# Patient Record
Sex: Male | Born: 1980 | Race: White | Hispanic: No | Smoking: Never smoker
Health system: Southern US, Community
[De-identification: ages and names within clinical notes are randomized; demographics above are authoritative.]

## PROBLEM LIST (undated history)

## (undated) DIAGNOSIS — M069 Rheumatoid arthritis, unspecified: Secondary | ICD-10-CM

## (undated) DIAGNOSIS — L409 Psoriasis, unspecified: Secondary | ICD-10-CM

## (undated) HISTORY — PX: KNEE SURGERY: SHX244

---

## 2010-09-11 ENCOUNTER — Emergency Department: Payer: Self-pay | Admitting: Emergency Medicine

## 2010-09-13 ENCOUNTER — Inpatient Hospital Stay: Payer: Self-pay | Admitting: Internal Medicine

## 2014-07-11 ENCOUNTER — Emergency Department
Admission: EM | Admit: 2014-07-11 | Discharge: 2014-07-11 | Disposition: A | Discharge status: Home / Self Care | Payer: BLUE CROSS/BLUE SHIELD | Attending: Emergency Medicine | Admitting: Emergency Medicine

## 2014-07-11 DIAGNOSIS — L237 Allergic contact dermatitis due to plants, except food: Secondary | ICD-10-CM | POA: Diagnosis not present

## 2014-07-11 DIAGNOSIS — R21 Rash and other nonspecific skin eruption: Secondary | ICD-10-CM | POA: Diagnosis present

## 2014-07-11 MED ORDER — PREDNISONE 10 MG PO TABS
10.0000 mg | ORAL_TABLET | Freq: Every day | ORAL | Status: DC
Start: 2014-07-11 — End: 2022-06-15

## 2014-07-11 MED ORDER — DEXAMETHASONE SODIUM PHOSPHATE 10 MG/ML IJ SOLN
10.0000 mg | Freq: Once | INTRAMUSCULAR | Status: AC
  Administered 2014-07-11: 10 mg via INTRAMUSCULAR

## 2014-07-11 MED ORDER — PREDNISONE 10 MG PO TABS: 10.0000 mg | ORAL_TABLET | Freq: Every day | ORAL | Status: DC

## 2014-07-11 MED ORDER — DEXAMETHASONE SODIUM PHOSPHATE 10 MG/ML IJ SOLN
INTRAMUSCULAR | Status: AC
  Filled 2014-07-11: qty 1

## 2014-07-11 NOTE — ED Notes (Addendum)
Pt c/o rash x 1 week.  Pt states rash on arms, torso, legs, and spot next to eye.  Rash red and bumpy.  Pt reports rash itches.  Pt reports he tried home remedies w/o relief.  NAD at this time.

## 2014-07-11 NOTE — ED Provider Notes (Signed)
CSN: [id number]     Arrival date & time 07/11/14  2007 History   First MD Initiated Contact with Patient 07/11/14 2259     Chief Complaint  Patient presents with  . Poison Ivy    Pt presents to ER stating he was exposed to poison ivy/oak a week ago and it has not improved. Pt has weeping wounds noted to bilat arms.     (Consider location/radiation/quality/duration/timing/severity/associated sxs/prior Treatment) HPI  34 year old male with 7 day history of rash to upper extremities, abdomen, back, bilateral legs. Patient states 7 days ago he was cutting brush with a chainsaw. He cut into poison ivy vine. One day later, he developed erythematous. It rash on the volar aspect of both forearms, abdomen and lower back. Patient has not been taking anything for the itching. He denies any chest pain shortness of breath or wheezing. Itching is moderate. He denies any significant pain.   No past medical history on file. No past surgical history on file. No family history on file. History  Substance Use Topics  . Smoking status: Not on file  . Smokeless tobacco: Not on file  . Alcohol Use: Not on file    Review of Systems  Constitutional: Negative.  Negative for fever, chills, activity change and appetite change.  HENT: Negative for congestion, ear pain, mouth sores, rhinorrhea, sinus pressure, sore throat and trouble swallowing.   Eyes: Negative for photophobia, pain and discharge.  Respiratory: Negative for cough, chest tightness and shortness of breath.   Cardiovascular: Negative for chest pain and leg swelling.  Gastrointestinal: Negative for nausea, vomiting, abdominal pain, diarrhea and abdominal distention.  Genitourinary: Negative for dysuria and difficulty urinating.  Musculoskeletal: Negative for back pain, arthralgias and gait problem.  Skin: Positive for rash. Negative for color change.  Neurological: Negative for dizziness and headaches.  Hematological: Negative for adenopathy.   Psychiatric/Behavioral: Negative for behavioral problems and agitation.      Allergies  Review of patient's allergies indicates no known allergies.  Home Medications   Prior to Admission medications   Medication Sig Start Date End Date Taking? Authorizing Provider  predniSONE (DELTASONE) 10 MG tablet Take 1 tablet (10 mg total) by mouth daily. 07/11/14   Evon Slack, PA-C   BP 128/89 mmHg  Pulse 102  Temp(Src) 99.2 F (37.3 C) (Oral)  Resp 20  Ht 5\' 9"  (1.753 m)  Wt 260 lb (117.935 kg)  BMI 38.38 kg/m2  SpO2 95% Physical Exam  Constitutional: He is oriented to person, place, and time. He appears well-developed and well-nourished.  HENT:  Head: Normocephalic and atraumatic.  Eyes: Conjunctivae and EOM are normal. Pupils are equal, round, and reactive to light.  Neck: Normal range of motion. Neck supple.  Cardiovascular: Normal rate, regular rhythm, normal heart sounds and intact distal pulses.   Pulmonary/Chest: Effort normal and breath sounds normal. No respiratory distress. He has no wheezes. He has no rales. He exhibits no tenderness.  Abdominal: Soft. Bowel sounds are normal.  Musculoskeletal: Normal range of motion. He exhibits no edema or tenderness.  Neurological: He is alert and oriented to person, place, and time. No cranial nerve deficit.  Skin: Skin is warm and dry. Rash noted. There is erythema.  Patient with erythematous papular rash that is confluent on the volar aspects of both forearms, abdomen, lower back and bilateral anterior thighs.  Psychiatric: He has a normal mood and affect. His behavior is normal. Judgment and thought content normal.    ED Course  Procedures (including critical care time) Labs Review Labs Reviewed - No data to display  Imaging Review No results found.   EKG Interpretation None      MDM   Final diagnoses:  Poison ivy dermatitis    Patient given injection of steroids in the ER. He was sent home with a 6 day  prednisone taper. He'll continue with Benadryl, calamine lotion. Avoid hot showers. Return to the ER for any worsening symptoms or changes in his health.    Evon Slack, PA-C 07/11/14 2323  Myrna Blazer, MD 07/12/14 [phone]

## 2014-07-11 NOTE — Discharge Instructions (Signed)
Contact Dermatitis °Contact dermatitis is a rash that happens when something touches the skin. You touched something that irritates your skin, or you have allergies to something you touched. °HOME CARE  °· Avoid the thing that caused your rash. °· Keep your rash away from hot water, soap, sunlight, chemicals, and other things that might bother it. °· Do not scratch your rash. °· You can take cool baths to help stop itching. °· Only take medicine as told by your doctor. °· Keep all doctor visits as told. °GET HELP RIGHT AWAY IF:  °· Your rash is not better after 3 days. °· Your rash gets worse. °· Your rash is puffy (swollen), tender, red, sore, or warm. °· You have problems with your medicine. °MAKE SURE YOU:  °· Understand these instructions. °· Will watch your condition. °· Will get help right away if you are not doing well or get worse. °Document Released: 12/04/2008 Document Revised: 05/01/2011 Document Reviewed: 07/12/2010 °[employer]® Patient Information ©2015 [employer]. This information is not intended to replace advice given to you by your health care provider. Make sure you discuss any questions you have with your health care provider. ° °

## 2014-07-11 NOTE — ED Notes (Signed)
Pt presents to ER stating he was exposed to poison ivy/oak a week ago and it has not improved. Pt has weeping wounds noted to bilat arms.

## 2016-01-12 ENCOUNTER — Emergency Department: Payer: Worker's Compensation

## 2016-01-12 ENCOUNTER — Emergency Department
Admission: EM | Admit: 2016-01-12 | Discharge: 2016-01-12 | Payer: Worker's Compensation | Attending: Emergency Medicine | Admitting: Emergency Medicine

## 2016-01-12 ENCOUNTER — Encounter: Payer: Self-pay | Admitting: Emergency Medicine

## 2016-01-12 DIAGNOSIS — W268XXA Contact with other sharp object(s), not elsewhere classified, initial encounter: Secondary | ICD-10-CM | POA: Insufficient documentation

## 2016-01-12 DIAGNOSIS — Z23 Encounter for immunization: Secondary | ICD-10-CM | POA: Insufficient documentation

## 2016-01-12 DIAGNOSIS — S51812A Laceration without foreign body of left forearm, initial encounter: Secondary | ICD-10-CM | POA: Insufficient documentation

## 2016-01-12 DIAGNOSIS — S59912A Unspecified injury of left forearm, initial encounter: Secondary | ICD-10-CM | POA: Diagnosis present

## 2016-01-12 DIAGNOSIS — S5402XA Injury of ulnar nerve at forearm level, left arm, initial encounter: Secondary | ICD-10-CM | POA: Insufficient documentation

## 2016-01-12 MED ORDER — TETANUS-DIPHTH-ACELL PERTUSSIS 5-2.5-18.5 LF-MCG/0.5 IM SUSP
0.5000 mL | Freq: Once | INTRAMUSCULAR | Status: AC
  Administered 2016-01-12: 0.5 mL via INTRAMUSCULAR
  Filled 2016-01-12 (×2): qty 0.5

## 2016-01-12 MED ORDER — CEFAZOLIN IN D5W 1 GM/50ML IV SOLN
1.0000 g | Freq: Once | INTRAVENOUS | Status: AC
  Administered 2016-01-12: 1 g via INTRAVENOUS
  Filled 2016-01-12: qty 50

## 2016-01-12 MED ORDER — LIDOCAINE-EPINEPHRINE (PF) 1 %-1:200000 IJ SOLN
INTRAMUSCULAR | Status: AC
  Filled 2016-01-12: qty 30

## 2016-01-12 MED ORDER — MORPHINE SULFATE (PF) 4 MG/ML IV SOLN
4.0000 mg | Freq: Once | INTRAVENOUS | Status: AC
  Administered 2016-01-12: 4 mg via INTRAVENOUS
  Filled 2016-01-12: qty 1

## 2016-01-12 MED ORDER — OXYCODONE-ACETAMINOPHEN 5-325 MG PO TABS
1.0000 | ORAL_TABLET | Freq: Once | ORAL | Status: AC
  Administered 2016-01-12: 1 via ORAL
  Filled 2016-01-12: qty 1

## 2016-01-12 NOTE — ED Provider Notes (Addendum)
[facility] Emergency Department Provider Note  ____________________________________________   I have reviewed the triage vital signs and the nursing notes.   HISTORY  Chief Complaint Extremity Laceration    HPI William Burgess is a 35 y.o. 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 He is right-hand dominant. He was using his left hand to push something metal and it slipped, he suffered a large laceration to left for arm. This happened medially prior to arrival here. He states he has decreased sensation in the fourth and fifth digit. Bleeding controlled. No other injury. Tetanus not up-to-date.     History reviewed. No pertinent past medical history.  There are no active problems to display for this patient.   Past Surgical History:  Procedure Laterality Date  . KNEE SURGERY      Prior to Admission medications   Medication Sig Start Date End Date Taking? Authorizing Provider  predniSONE (DELTASONE) 10 MG tablet Take 1 tablet (10 mg total) by mouth daily. 07/11/14   Evon Slack, PA-C  predniSONE (DELTASONE) 10 MG tablet Take 1 tablet (10 mg total) by mouth daily. 6,5,4,3,2,1 six day taper 07/11/14   Evon Slack, PA-C    Allergies Patient has no known allergies.  No family history on file.  Social History Social History  Substance Use Topics  . Smoking status: Never Smoker  . Smokeless tobacco: Never Used  . Alcohol use No    Review of Systems Constitutional: No fever/chills Eyes: No visual changes. ENT: No sore throat. No stiff neck no neck pain Cardiovascular: Denies chest pain. Respiratory: Denies shortness of breath. Gastrointestinal:   no vomiting.  No diarrhea.  No constipation. Genitourinary: Negative for dysuria. Musculoskeletal: Negative lower extremity swelling Skin: Negative for rash. Neurological: Negative for severe headaches, focal weakness or  numbness. 10-point ROS otherwise negative.  ____________________________________________   PHYSICAL EXAM:  VITAL SIGNS: ED Triage Vitals  Enc Vitals Group     BP 01/12/16 1730 (!) 138/95     Pulse Rate 01/12/16 1730 68     Resp 01/12/16 1730 18     Temp 01/12/16 1730 99.1 F (37.3 C)     Temp Source 01/12/16 1730 Oral     SpO2 01/12/16 1730 98 %     Weight 01/12/16 1730 275 lb (124.7 kg)     Height 01/12/16 1730 6\' 2"  (1.88 m)     Head Circumference --      Peak Flow --      Pain Score 01/12/16 1743 5     Pain Loc --      Pain Edu? --      Excl. in GC? --     Constitutional: Alert and oriented. Well appearing and in no acute distress. Eyes: Conjunctivae are normal. PERRL. EOMI. Head: Atraumatic. Nose: No congestion/rhinnorhea. Mouth/Throat: Mucous membranes are moist.  Oropharynx non-erythematous. Neck: No stridor.   Nontender with no meningismus Cardiovascular: Normal rate, regular rhythm. Grossly normal heart sounds.  Good peripheral circulation. Respiratory: Normal respiratory effort.  No retractions. Lungs CTAB. Abdominal: Soft and nontender. No distention. No guarding no rebound Back:  There is no focal tenderness or step off.  there is no midline tenderness there are no lesions noted. there is no CVA tenderness Musculoskeletal: There is a large, approximately 12 x 5 7D keeping wound to the left ulnar region of the forearm into the muscle. There is an obvious tendon injury.  There is no active bleeding at this time. No foreign body noted. He has strong radial and somewhat weaker but present ulnar pulse on that side. No joint effusions, no DVT signs strong distal pulses no edema Neurologic:  Normal speech and language.  In his hand exam, appears to have intact sensation and strength are the radial and medial aspects of the hand however the ulnar nerve appears to be injured. There is no ability to abduct the fingers, and he has decreased sensation in the fourth and fifth  finger which she describes only has pressure, he does not have good 2 point discrimination there. His ability to flex and extend especially the radial and ulnar fingers also somewhat limited. Skin:  Skin is warm, dry and intact. No rash noted. Psychiatric: Mood and affect are normal. Speech and behavior are normal.  ____________________________________________   LABS (all labs ordered are listed, but only abnormal results are displayed)  Labs Reviewed - No data to display ____________________________________________  EKG  I personally interpreted any EKGs ordered by me or triage  ____________________________________________  RADIOLOGY  I reviewed any imaging ordered by me or triage that were performed during my shift and, if possible, patient and/or family made aware of any abnormal findings. ____________________________________________   PROCEDURES  Procedure(s) performed: None  Procedures  Critical Care performed: None  ____________________________________________   INITIAL IMPRESSION / ASSESSMENT AND PLAN / ED COURSE  Pertinent labs & imaging results that were available during my care of the patient were reviewed by me and considered in my medical decision making (see chart for details).  Patient with an ulnar nerve and tendon injury to the left hand. I discussed with Dr. Martha Clan on call for orthopedic surgery who is a patient absolutely needs to go to a tertiary care center for hand surgeon evaluation. I have paged [facility] hand surgery. We are waiting a callback. In addition, I have irrigated the wound copiously. There is no active bleeding at this time. Perhaps a slight bruise but no arterial bleeding noted. We have given him a tetanus shot, we will give him Ancef, pain medication, and we are waiting follow-up from orthopedic surgery. Patient remains vascularly intact at this time. During the irrigation there was a small arterial bleed but that stopped with direct pressure  and I have rechecked the wound since with no evidence of ongoing bleeding at this time other there is a slight ooze.  ----------------------------------------- 6:54 PM on 01/12/2016 -----------------------------------------  [facility] accepted the pt. Agree w mgt   ----------------------------------------- 8:20 PM on 01/12/2016 -----------------------------------------  Awaiting transport still with good cap refill  fb seen on x ray not apparently from this injury. No evidence of injury where it is noted.  May have had old injury he states.  ----------------------------------------- 9:16 PM on 01/12/2016 -----------------------------------------  Apparently, there is a many hour delay for transport which persists and likely will last much longer and the night. There are no available trucks and multiple patients waiting for transfer ahead of him. I did talk to him about taking his IV out and having him go by private vehicle as this time he has been hemolytically stable since his arrival and we have been waiting now for over 2 hours for transport and likely will be waiting many more. Patient very cut with this plan. His girlfriend will drive him. We'll place him in a splint to make sure that he has good stability on transport. I called the charge nurse at [facility], and they agree with  this plan and will try to expedite getting him back to a room. I feel that this is the patient's best interest as otherwise he will be waiting for possibly 2-3 more hours for a transport which essentially will be BLS.  Clinical Course    ____________________________________________   FINAL CLINICAL IMPRESSION(S) / ED DIAGNOSES  Final diagnoses:  None      This chart was dictated using voice recognition software.  Despite best efforts to proofread,  errors can occur which can change meaning.      Jeanmarie Plant, MD 01/12/16 1846    Jeanmarie Plant, MD 01/12/16 5701    Jeanmarie Plant, MD 01/12/16  [id number]    Jeanmarie Plant, MD 01/12/16 2117

## 2016-01-12 NOTE — ED Notes (Signed)
Prior to worker's comp. Screening patient was given 4 mg of morphine at 18:30

## 2016-01-12 NOTE — ED Notes (Addendum)
Left forearm laceration with piece of metal. Approx 5 inches, deep. Bleeding controlled with gauze at this time. MD at bedside. Decreased sensation to 4th and 5th digits, no full ROM.

## 2016-01-12 NOTE — ED Notes (Signed)
Pt. Being transferred to [facility] via personal vehicle.  Pt. Girlfriend is driving pt. To [facility] ED.

## 2016-01-12 NOTE — Discharge Instructions (Signed)
Go directly to [facility] emergency room for further care.

## 2016-01-12 NOTE — ED Notes (Signed)
Wound irrigated, saline gauze applied

## 2017-08-14 IMAGING — DX DG FOREARM 2V*L*
2 series · 2 of 2 positions shown · non-contrast
Comparison: None.

CLINICAL DATA: Deep laceration to the left forearm.

EXAM:
LEFT FOREARM - 2 VIEW

[forearm ap]
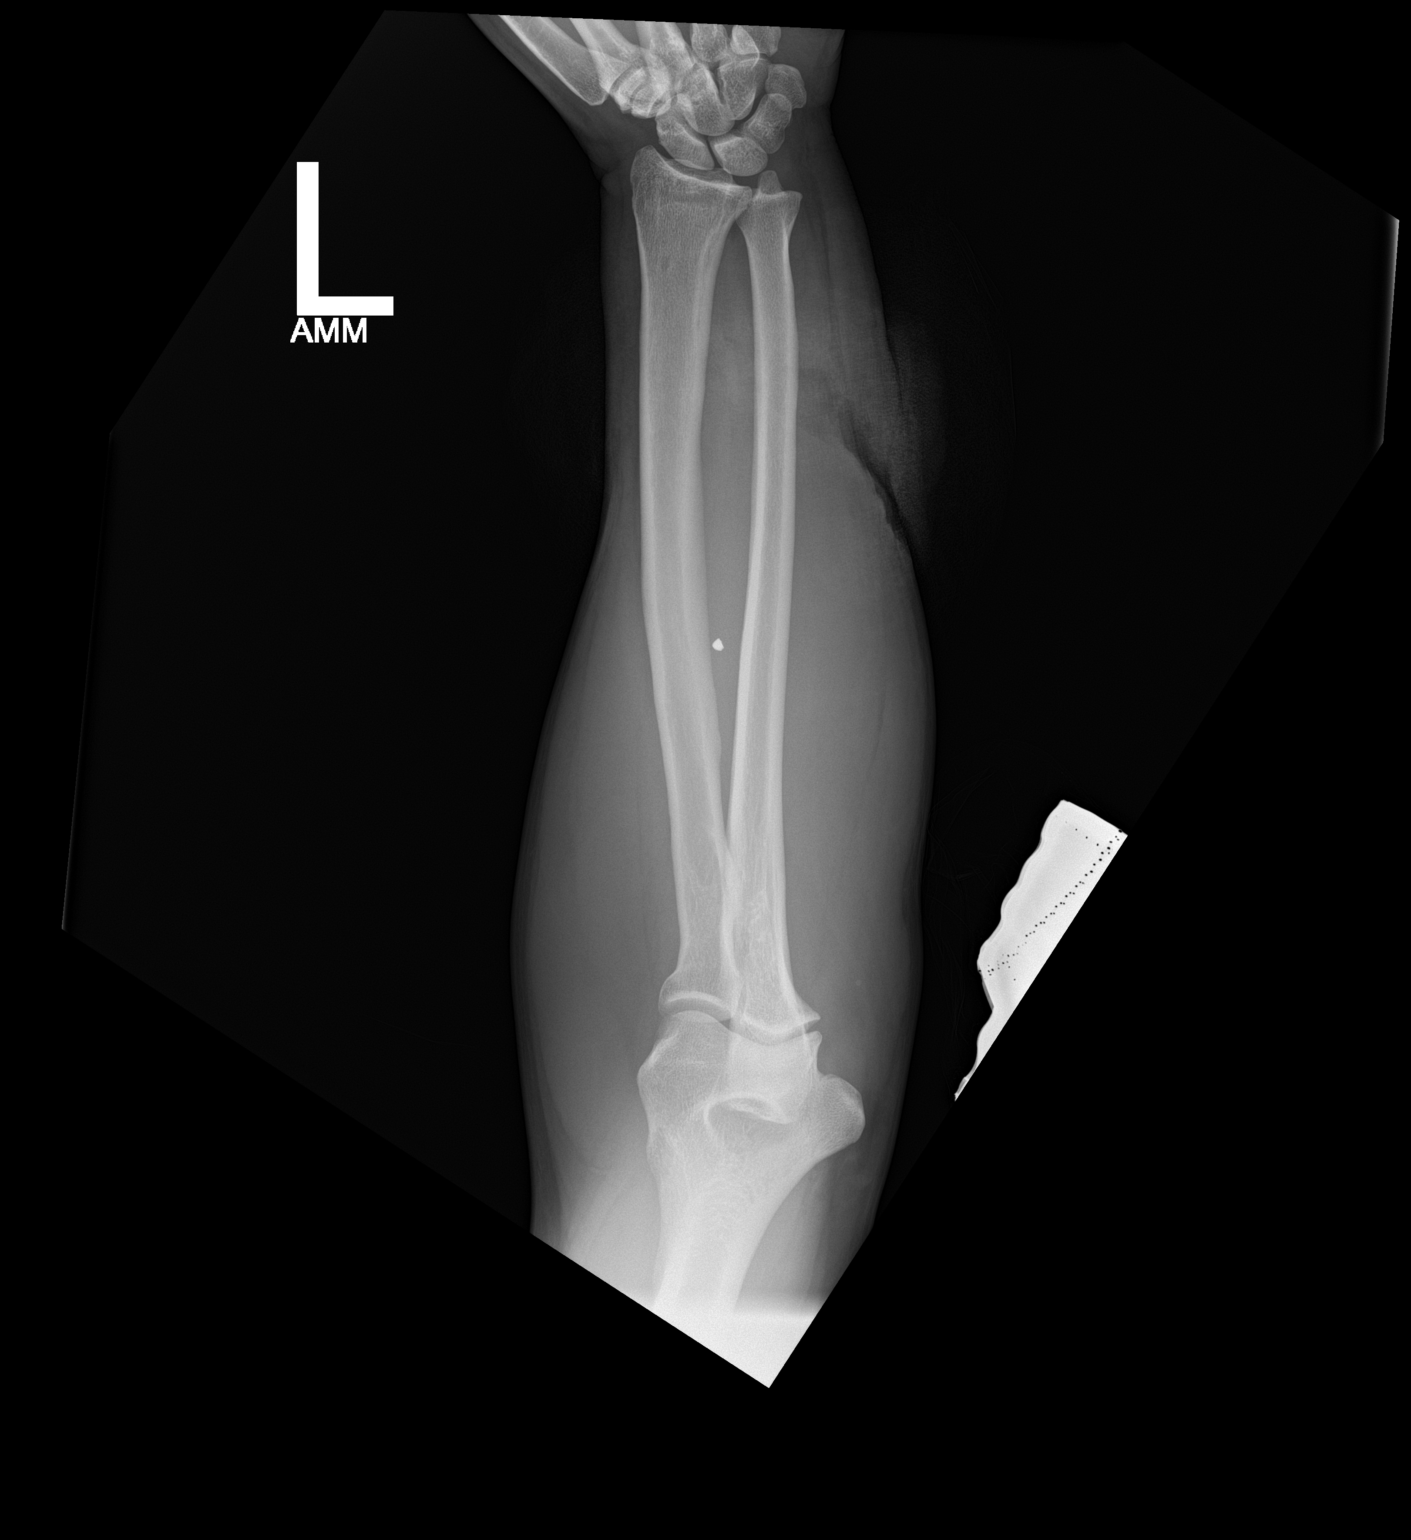

[forearm lat]
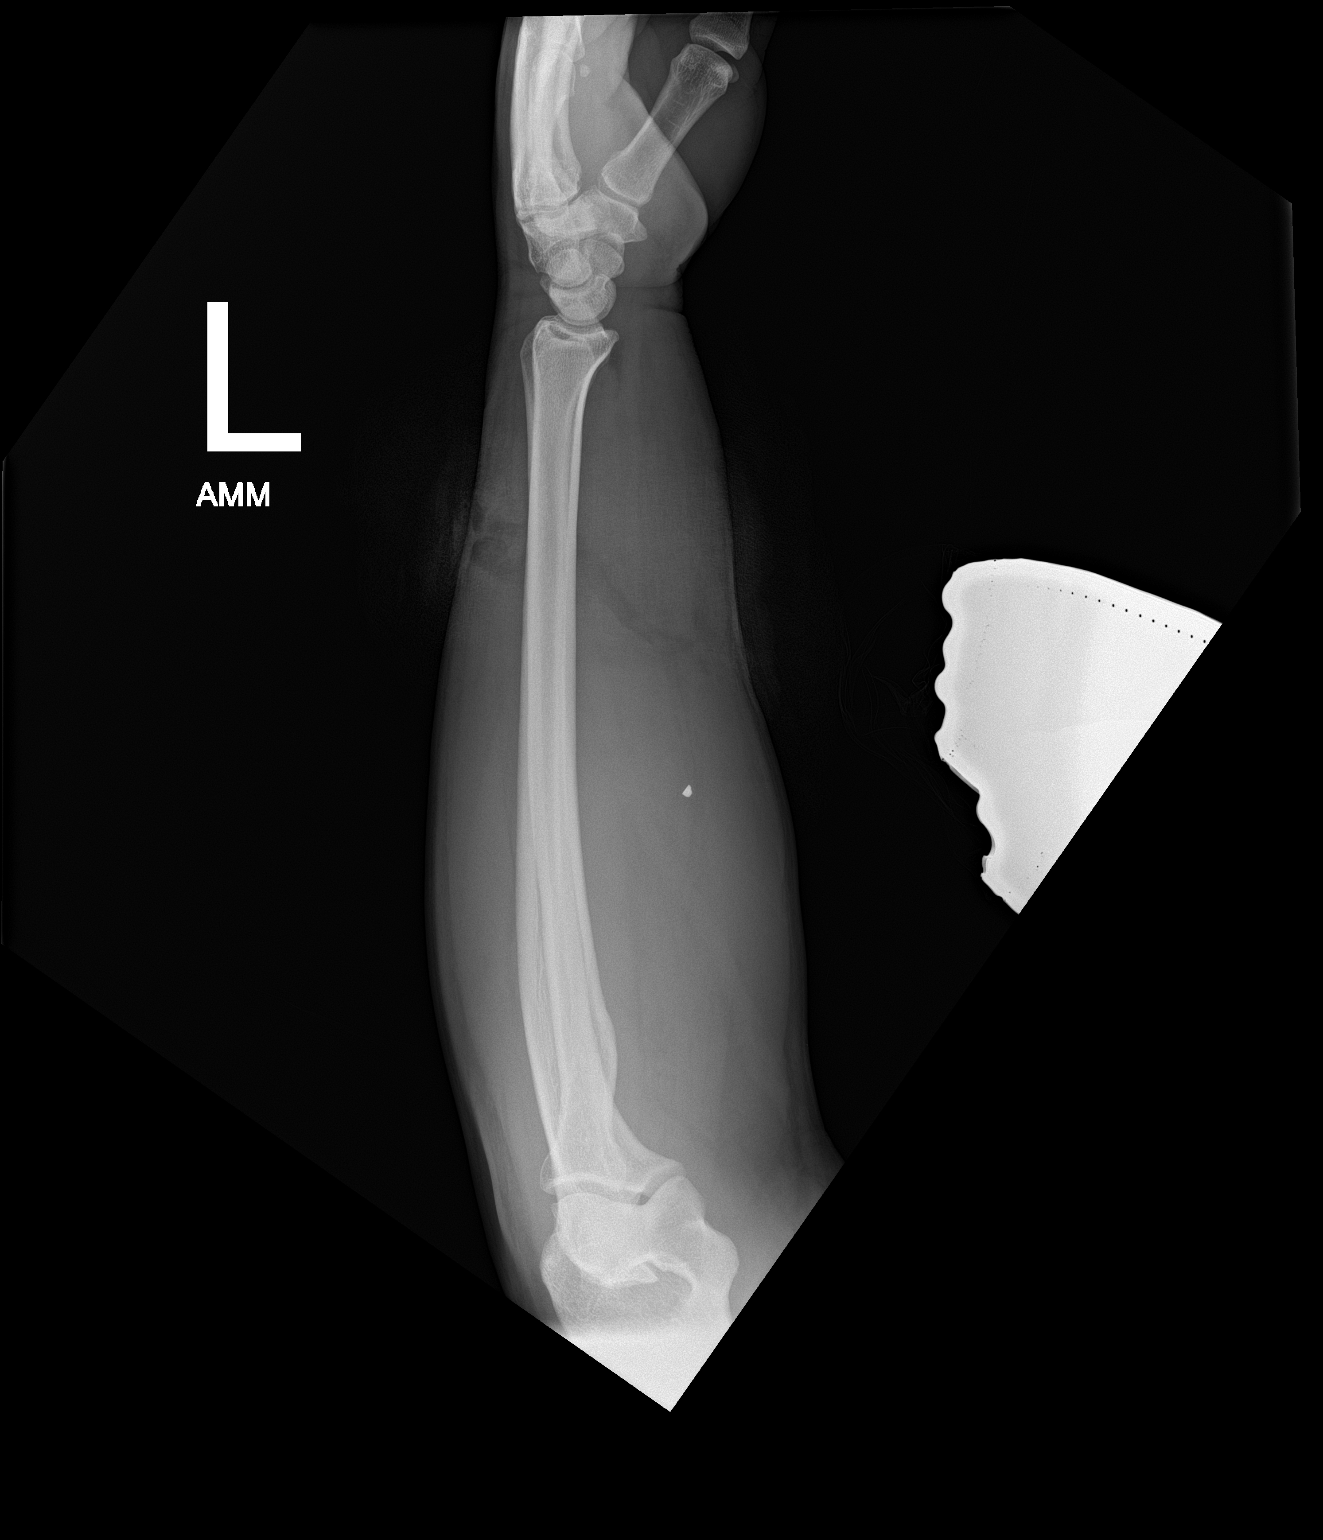

[2 of 2 positions shown; findings below may reference images not displayed]

FINDINGS: There is metallic foreign body in the soft tissues of the mid
forearm. The laceration appears to be more distal in the forearm.
There is no fracture or bone avulsion.
IMPRESSION: Normal bones. Foreign body in the soft tissues. Deep distal forearm
laceration.

## 2017-10-23 ENCOUNTER — Emergency Department
Admission: EM | Admit: 2017-10-23 | Discharge: 2017-10-23 | Disposition: A | Discharge status: Home / Self Care | Payer: BLUE CROSS/BLUE SHIELD | Attending: Student in an Organized Health Care Education/Training Program | Admitting: Student in an Organized Health Care Education/Training Program

## 2017-10-23 ENCOUNTER — Other Ambulatory Visit: Payer: Self-pay

## 2017-10-23 ENCOUNTER — Encounter: Payer: Self-pay | Admitting: Emergency Medicine

## 2017-10-23 DIAGNOSIS — R21 Rash and other nonspecific skin eruption: Secondary | ICD-10-CM | POA: Diagnosis present

## 2017-10-23 DIAGNOSIS — L237 Allergic contact dermatitis due to plants, except food: Secondary | ICD-10-CM | POA: Diagnosis not present

## 2017-10-23 MED ORDER — DEXAMETHASONE SODIUM PHOSPHATE 10 MG/ML IJ SOLN
10.0000 mg | Freq: Once | INTRAMUSCULAR | Status: AC
  Administered 2017-10-23: 10 mg via INTRAMUSCULAR
  Filled 2017-10-23: qty 1

## 2017-10-23 NOTE — ED Triage Notes (Signed)
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

## 2017-10-23 NOTE — ED Provider Notes (Signed)
[facility] Emergency Department Provider Note  ____________________________________________  Time seen: Approximately 11:37 PM  I have reviewed the triage vital signs and the nursing notes.   HISTORY  Chief Complaint Rash   HPI William Burgess is a 37 y.o. male who presents to the emergency department for treatment and evaluation of poison ivy to arms and face that started yesterday after clearing some trees. In the past Decadron has cleared it quickly. No alleviating measures prior to arrival.   History reviewed. No pertinent past medical history.  There are no active problems to display for this patient.   Past Surgical History:  Procedure Laterality Date  . KNEE SURGERY      Prior to Admission medications   Medication Sig Start Date End Date Taking? Authorizing Provider  predniSONE (DELTASONE) 10 MG tablet Take 1 tablet (10 mg total) by mouth daily. 07/11/14   Evon Slack, PA-C  predniSONE (DELTASONE) 10 MG tablet Take 1 tablet (10 mg total) by mouth daily. 6,5,4,3,2,1 six day taper 07/11/14   Evon Slack, PA-C    Allergies Patient has no known allergies.  No family history on file.  Social History Social History   Tobacco Use  . Smoking status: Never Smoker  . Smokeless tobacco: Never Used  Substance Use Topics  . Alcohol use: No  . Drug use: Not on file    Review of Systems  Constitutional: Negative for fever. Respiratory: Negative for cough or shortness of breath.  Musculoskeletal: Negative for myalgias Skin: Positive for rash on arms and face. Neurological: Negative for numbness or paresthesias. ____________________________________________   PHYSICAL EXAM:  VITAL SIGNS: ED Triage Vitals  Enc Vitals Group     BP 10/23/17 2107 (!) 143/84     Pulse Rate 10/23/17 2107 83     Resp 10/23/17 2107 20     Temp 10/23/17 2107 97.9 F (36.6 C)     Temp Source 10/23/17 2107 Oral     SpO2 10/23/17 2107 97 %     Weight  10/23/17 2106 270 lb (122.5 kg)     Height 10/23/17 2106 6\' 1"  (1.854 m)     Head Circumference --      Peak Flow --      Pain Score 10/23/17 2106 0     Pain Loc --      Pain Edu? --      Excl. in GC? --      Constitutional: Well appearing. Eyes: Conjunctivae are clear without discharge or drainage. Nose: No rhinorrhea noted. Mouth/Throat: Airway is patent.  Neck: No stridor. Unrestricted range of motion observed. Cardiovascular: Capillary refill is <3 seconds.  Respiratory: Respirations are even and unlabored.. Musculoskeletal: Unrestricted range of motion observed. Neurologic: Awake, alert, and oriented x 4.  Skin:  Vesicular, linear rash on arms and face.  ____________________________________________   LABS (all labs ordered are listed, but only abnormal results are displayed)  Labs Reviewed - No data to display ____________________________________________  EKG  Not indicated. ____________________________________________  RADIOLOGY  Not indicated. ____________________________________________   PROCEDURES  Procedures ____________________________________________   INITIAL IMPRESSION / ASSESSMENT AND PLAN / ED COURSE  William Burgess is a 37 y.o. male who presents to the emergency department for treatment and evaluation of rash due to poison ivy exposure. He was given Decadron IM here and discharged home. He is to follow up with the primary care provider if not improving over the next few days or return to the emergency department.   Medications  dexamethasone (DECADRON) injection 10 mg (10 mg Intramuscular Given 10/23/17 2307)     Pertinent labs & imaging results that were available during my care of the patient were reviewed by me and considered in my medical decision making (see chart for details).  ____________________________________________   FINAL CLINICAL IMPRESSION(S) / ED DIAGNOSES  Final diagnoses:  Poison ivy dermatitis    ED Discharge Orders     None       Note:  This document was prepared using Dragon voice recognition software and may include unintentional dictation errors.    Chinita Pester, FNP 10/23/17 2342    Willy Eddy, MD 10/23/17 [phone]

## 2019-11-11 ENCOUNTER — Ambulatory Visit
Admission: EM | Admit: 2019-11-11 | Discharge: 2019-11-11 | Disposition: A | Discharge status: Home / Self Care | Payer: BC Managed Care – PPO | Attending: Emergency Medicine | Admitting: Emergency Medicine

## 2019-11-11 ENCOUNTER — Other Ambulatory Visit: Payer: Self-pay

## 2019-11-11 DIAGNOSIS — Z79899 Other long term (current) drug therapy: Secondary | ICD-10-CM | POA: Insufficient documentation

## 2019-11-11 DIAGNOSIS — J069 Acute upper respiratory infection, unspecified: Secondary | ICD-10-CM

## 2019-11-11 DIAGNOSIS — Z7952 Long term (current) use of systemic steroids: Secondary | ICD-10-CM | POA: Insufficient documentation

## 2019-11-11 DIAGNOSIS — R030 Elevated blood-pressure reading, without diagnosis of hypertension: Secondary | ICD-10-CM | POA: Diagnosis not present

## 2019-11-11 DIAGNOSIS — U071 COVID-19: Secondary | ICD-10-CM | POA: Insufficient documentation

## 2019-11-11 DIAGNOSIS — L409 Psoriasis, unspecified: Secondary | ICD-10-CM | POA: Diagnosis not present

## 2019-11-11 DIAGNOSIS — M069 Rheumatoid arthritis, unspecified: Secondary | ICD-10-CM | POA: Insufficient documentation

## 2019-11-11 DIAGNOSIS — R197 Diarrhea, unspecified: Secondary | ICD-10-CM | POA: Insufficient documentation

## 2019-11-11 HISTORY — DX: Psoriasis, unspecified: L40.9

## 2019-11-11 HISTORY — DX: Rheumatoid arthritis, unspecified: M06.9

## 2019-11-11 LAB — OCCULT BLOOD X 1 CARD TO LAB, STOOL: Fecal Occult Bld: NEGATIVE

## 2019-11-11 LAB — SARS CORONAVIRUS 2 (TAT 6-24 HRS): SARS Coronavirus 2: POSITIVE — AB

## 2019-11-11 LAB — GROUP A STREP BY PCR: Group A Strep by PCR: NOT DETECTED

## 2019-11-11 NOTE — ED Provider Notes (Signed)
[facility]    CSN: [id number] Arrival date & time: 11/11/19  0801      History   Chief Complaint Chief Complaint  Patient presents with  . Fever  . Cough  . Diarrhea  . Nasal Congestion    HPI William Burgess is a 39 y.o. male.   39 yo male here for evaluation of cough, fever, sinus congestion, and diarrhea.  HE reports that his symptoms started 6 days ago. The diarrhea started 3 days ago, is coming every 30 minutes for large volumes and is black.  He denies SOB, ST,  N/V, Abdominal pain, CP, dizziness or weakness. Pt is hypertensive in triage.   He denies ASA or NSAID use. HE has bene taking Dayquil/Nyquil and Tylenol Cough and Cold at home.      Past Medical History:  Diagnosis Date  . Psoriasis   . Rheumatoid arthritis (HCC)     There are no problems to display for this patient.   Past Surgical History:  Procedure Laterality Date  . KNEE SURGERY         Home Medications    Prior to Admission medications   Medication Sig Start Date End Date Taking? Authorizing Provider  COSENTYX SENSOREADY, 300 MG, 150 MG/ML SOAJ Inject 2 Syringes into the skin every 28 (twenty-eight) days. 10/31/19   [provider]  predniSONE (DELTASONE) 10 MG tablet Take 1 tablet (10 mg total) by mouth daily. 07/11/14   Evon Slack, PA-C  predniSONE (DELTASONE) 10 MG tablet Take 1 tablet (10 mg total) by mouth daily. 6,5,4,3,2,1 six day taper 07/11/14   Evon Slack, PA-C    Family History No family history on file.  Social History Social History   Tobacco Use  . Smoking status: Never Smoker  . Smokeless tobacco: Never Used  Substance Use Topics  . Alcohol use: No  . Drug use: Not on file     Allergies   Patient has no known allergies.   Review of Systems Review of Systems  Constitutional: Positive for fever. Negative for activity change, appetite change, chills and fatigue.  HENT: Positive for congestion, postnasal drip, rhinorrhea and  sinus pressure. Negative for ear pain and sore throat.   Respiratory: Positive for cough. Negative for shortness of breath and wheezing.   Cardiovascular: Negative for chest pain.  Gastrointestinal: Positive for blood in stool and diarrhea. Negative for abdominal pain, nausea and vomiting.  Genitourinary: Negative.   Musculoskeletal: Negative for arthralgias and myalgias.  Skin: Negative.   Neurological: Negative for dizziness, syncope and headaches.  Hematological: Negative.   Psychiatric/Behavioral: Negative.      Physical Exam Triage Vital Signs ED Triage Vitals  Enc Vitals Group     BP 11/11/19 0826 (!) 173/99     Pulse Rate 11/11/19 0826 87     Resp 11/11/19 0826 18     Temp 11/11/19 0826 98.8 F (37.1 C)     Temp Source 11/11/19 0826 Oral     SpO2 11/11/19 0826 96 %     Weight 11/11/19 0827 286 lb (129.7 kg)     Height 11/11/19 0827 6' (1.829 m)     Head Circumference --      Peak Flow --      Pain Score 11/11/19 0827 0     Pain Loc --      Pain Edu? --      Excl. in GC? --    No data found.  Updated Vital Signs BP Marland Kitchen)  173/99 (BP Location: Left Arm)   Pulse 87   Temp 98.8 F (37.1 C) (Oral)   Resp 18   Ht 6' (1.829 m)   Wt 286 lb (129.7 kg)   SpO2 96%   BMI 38.79 kg/m   Visual Acuity Right Eye Distance:   Left Eye Distance:   Bilateral Distance:    Right Eye Near:   Left Eye Near:    Bilateral Near:     Physical Exam Vitals and nursing note reviewed.  Constitutional:      Appearance: Normal appearance. He is obese.  HENT:     Head: Normocephalic and atraumatic.     Right Ear: Tympanic membrane, ear canal and external ear normal.     Left Ear: Tympanic membrane, ear canal and external ear normal.     Nose: Rhinorrhea present. Rhinorrhea is clear.     Right Sinus: No maxillary sinus tenderness or frontal sinus tenderness.     Left Sinus: No maxillary sinus tenderness or frontal sinus tenderness.     Comments: Clear nasal discharge. The nasal  mucosa is pink and mildly edematous.     Mouth/Throat:     Mouth: Mucous membranes are moist.     Pharynx: Oropharynx is clear.  Eyes:     Extraocular Movements: Extraocular movements intact.     Conjunctiva/sclera: Conjunctivae normal.     Pupils: Pupils are equal, round, and reactive to light.  Cardiovascular:     Rate and Rhythm: Normal rate and regular rhythm.     Pulses: Normal pulses.     Heart sounds: Normal heart sounds.  Pulmonary:     Effort: Pulmonary effort is normal.     Breath sounds: Normal breath sounds.  Abdominal:     General: Abdomen is flat. Bowel sounds are normal.     Palpations: Abdomen is soft. There is no mass.     Tenderness: There is no abdominal tenderness. There is no guarding or rebound.  Genitourinary:    Prostate: Normal.     Rectum: Normal.  Musculoskeletal:        General: Normal range of motion.     Cervical back: Normal range of motion and neck supple.  Lymphadenopathy:     Cervical: No cervical adenopathy.  Skin:    General: Skin is warm and dry.     Capillary Refill: Capillary refill takes less than 2 seconds.  Neurological:     General: No focal deficit present.     Mental Status: He is alert.  Psychiatric:        Mood and Affect: Mood normal.        Behavior: Behavior normal.        Thought Content: Thought content normal.        Judgment: Judgment normal.      UC Treatments / Results  Labs (all labs ordered are listed, but only abnormal results are displayed) Labs Reviewed  GROUP A STREP BY PCR  SARS CORONAVIRUS 2 (TAT 6-24 HRS)  OCCULT BLOOD X 1 CARD TO LAB, STOOL    EKG   Radiology No results found.  Procedures Procedures (including critical care time)  Medications Ordered in UC Medications - No data to display  Initial Impression / Assessment and Plan / UC Course  I have reviewed the triage vital signs and the nursing notes.  Pertinent labs & imaging results that were available during my care of the patient  were reviewed by me and considered in my medical decision making (  see chart for details).   Patient c/o cough, fever, sinus congestion and drainage. He is also having frequent diarrhea that he says in black. Cold symptoms started 6 days ago, diarrhea 3 days ago.  Mild clear nasal discharge present on exam, no sinus tenderness. No lymphadenopathy. LS CTA. Abdomen is soft and non-tender. Stool guaiac collected- Stool was black. Denies ASA, ETOH, or NSAID use.  Will check COVID and Strep as son has both, if guaiac is positive will check CBC.   Guaiac and strep negative- COVID pending.  Will D/C with supportive care and precautions.  Final Clinical Impressions(s) / UC Diagnoses   Final diagnoses:  Viral upper respiratory tract infection  Diarrhea, unspecified type     Discharge Instructions     Isolate at home until you receive the results of your COVID test.  If you are positive you need to quarantine for at least 10 days from start of symptoms and are considered clear after 10 days with improvement of symptoms and no fever requiring medication for 24 hours.   Use OTC Tylenol and Ibuprofen as needed for fever. Honey based cough preparations are effective for managing cough.  Use water and Pedialyte to maintain hydration.   You can use OTC Imodium as needed for diarrhea. Do not exceed package daily dosage amounts.   Return for re-evaluation of you develop shortness of breath, especially at rest, worsening diarrhea and vomiting where you cannot take in and keep down fluids.    ED Prescriptions    None     PDMP not reviewed this encounter.   Becky Augusta, NP 11/11/19 [phone]

## 2019-11-11 NOTE — Discharge Instructions (Addendum)
Isolate at home until you receive the results of your COVID test.  If you are positive you need to quarantine for at least 10 days from start of symptoms and are considered clear after 10 days with improvement of symptoms and no fever requiring medication for 24 hours.   Use OTC Tylenol and Ibuprofen as needed for fever. Honey based cough preparations are effective for managing cough.  Use water and Pedialyte to maintain hydration.   You can use OTC Imodium as needed for diarrhea. Do not exceed package daily dosage amounts.   Return for re-evaluation of you develop shortness of breath, especially at rest, worsening diarrhea and vomiting where you cannot take in and keep down fluids.

## 2019-11-11 NOTE — ED Triage Notes (Addendum)
Patient in today w/ c/o cough, fever, sinus congestion and drainage, diarrhea which he describes as black and watery. Patient denies loss of taste/smell, SOB (except after coughing), N/V.   Patient's son tested positive for COVID 1 wk ago.   Patient is not vaccinated against COVID-19.

## 2019-11-12 ENCOUNTER — Telehealth: Payer: Self-pay | Admitting: Internal Medicine

## 2019-11-12 ENCOUNTER — Other Ambulatory Visit: Payer: Self-pay | Admitting: Nurse Practitioner

## 2019-11-12 DIAGNOSIS — U071 COVID-19: Secondary | ICD-10-CM

## 2019-11-12 DIAGNOSIS — E6609 Other obesity due to excess calories: Secondary | ICD-10-CM

## 2019-11-12 NOTE — Telephone Encounter (Signed)
Called to Discuss with patient about Covid symptoms and the use of the monoclonal antibody infusion for those with mild to moderate Covid symptoms and at a high risk of hospitalization.     Pt appears to qualify for this infusion due to co-morbid conditions and/or a member of an at-risk group in accordance with the FDA Emergency Use Authorization.    Pt declines MAB infusion and did not wish to have infusion hotline number to call back should he change is mind. He is on day 9 of symptoms today.   Cyndee Brightly, NP [facility]

## 2019-11-12 NOTE — Progress Notes (Signed)
I connected by phone with William Burgess on 11/12/2019 at 4:36 PM to discuss the potential use of a new treatment for mild to moderate COVID-19 viral infection in non-hospitalized patients.  This patient is a 39 y.o. male that meets the FDA criteria for Emergency Use Authorization of COVID monoclonal antibody casirivimab/imdevimab.  Has a (+) direct SARS-CoV-2 viral test result  Has mild or moderate COVID-19   Is NOT hospitalized due to COVID-19  Is within 10 days of symptom onset  Has at least one of the high risk factor(s) for progression to severe COVID-19 and/or hospitalization as defined in EUA.  Specific high risk criteria : BMI > 25   I have spoken and communicated the following to the patient or parent/caregiver regarding COVID monoclonal antibody treatment:  1. FDA has authorized the emergency use for the treatment of mild to moderate COVID-19 in adults and pediatric patients with positive results of direct SARS-CoV-2 viral testing who are 10 years of age and older weighing at least 40 kg, and who are at high risk for progressing to severe COVID-19 and/or hospitalization.  2. The significant known and potential risks and benefits of COVID monoclonal antibody, and the extent to which such potential risks and benefits are unknown.  3. Information on available alternative treatments and the risks and benefits of those alternatives, including clinical trials.  4. Patients treated with COVID monoclonal antibody should continue to self-isolate and use infection control measures (e.g., wear mask, isolate, social distance, avoid sharing personal items, clean and disinfect "high touch" surfaces, and frequent handwashing) according to CDC guidelines.   5. The patient or parent/caregiver has the option to accept or refuse COVID monoclonal antibody treatment.  After reviewing this information with the patient, The patient agreed to proceed with receiving casirivimab\imdevimab infusion and will  be provided a copy of the Fact sheet prior to receiving the infusion. [name] 11/12/2019 4:36 PM

## 2019-11-13 ENCOUNTER — Ambulatory Visit (HOSPITAL_COMMUNITY)
Admission: RE | Admit: 2019-11-13 | Discharge: 2019-11-13 | Disposition: A | Discharge status: Home / Self Care | Payer: BC Managed Care – PPO | Source: Ambulatory Visit | Attending: Pulmonary Disease | Admitting: Pulmonary Disease

## 2019-11-13 DIAGNOSIS — U071 COVID-19: Secondary | ICD-10-CM | POA: Insufficient documentation

## 2019-11-13 DIAGNOSIS — E6609 Other obesity due to excess calories: Secondary | ICD-10-CM | POA: Diagnosis present

## 2019-11-13 DIAGNOSIS — Z6838 Body mass index (BMI) 38.0-38.9, adult: Secondary | ICD-10-CM | POA: Insufficient documentation

## 2019-11-13 MED ORDER — DIPHENHYDRAMINE HCL 50 MG/ML IJ SOLN: 50.0000 mg | Freq: Once | INTRAMUSCULAR | Status: DC | PRN

## 2019-11-13 MED ORDER — SODIUM CHLORIDE 0.9 % IV SOLN: INTRAVENOUS | Status: DC | PRN

## 2019-11-13 MED ORDER — EPINEPHRINE 0.3 MG/0.3ML IJ SOAJ: 0.3000 mg | Freq: Once | INTRAMUSCULAR | Status: DC | PRN

## 2019-11-13 MED ORDER — METHYLPREDNISOLONE SODIUM SUCC 125 MG IJ SOLR: 125.0000 mg | Freq: Once | INTRAMUSCULAR | Status: DC | PRN

## 2019-11-13 MED ORDER — ALBUTEROL SULFATE HFA 108 (90 BASE) MCG/ACT IN AERS: 2.0000 | INHALATION_SPRAY | Freq: Once | RESPIRATORY_TRACT | Status: DC | PRN

## 2019-11-13 MED ORDER — SODIUM CHLORIDE 0.9 % IV SOLN
1200.0000 mg | Freq: Once | INTRAVENOUS | Status: AC
  Administered 2019-11-13: 1200 mg via INTRAVENOUS

## 2019-11-13 MED ORDER — FAMOTIDINE IN NACL 20-0.9 MG/50ML-% IV SOLN: 20.0000 mg | Freq: Once | INTRAVENOUS | Status: DC | PRN

## 2019-11-13 NOTE — Progress Notes (Signed)
  Diagnosis: COVID-19  Physician: Patrick Wright, MD  Procedure: Covid Infusion Clinic Med: casirivimab\imdevimab infusion - Provided patient with casirivimab\imdevimab fact sheet for patients, parents and caregivers prior to infusion.  Complications: No immediate complications noted.  Discharge: Discharged home   Hollis Oh N Janelli Welling 11/13/2019  

## 2019-11-13 NOTE — Discharge Instructions (Signed)

## 2022-06-15 ENCOUNTER — Encounter: Payer: Self-pay | Admitting: Urology

## 2022-06-15 ENCOUNTER — Ambulatory Visit: Payer: BC Managed Care – PPO | Admitting: Urology

## 2022-06-15 VITALS — BP 111/71 | HR 65 | Ht 73.0 in | Wt 245.0 lb

## 2022-06-15 DIAGNOSIS — Z125 Encounter for screening for malignant neoplasm of prostate: Secondary | ICD-10-CM

## 2022-06-15 DIAGNOSIS — Z3009 Encounter for other general counseling and advice on contraception: Secondary | ICD-10-CM | POA: Diagnosis not present

## 2022-06-15 MED ORDER — DIAZEPAM 5 MG PO TABS: 5.0000 mg | ORAL_TABLET | Freq: Once | ORAL | 0 refills | Status: DC | PRN

## 2022-06-15 NOTE — Patient Instructions (Signed)

## 2022-06-15 NOTE — Progress Notes (Signed)
   06/15/22 10:37 AM   William Burgess 04/23/1980 [id number]  CC: Discuss vasectomy, PSA screening  HPI: 42 year old male here to discuss vasectomy.  He has 2 children and does not desire any further biologic pregnancies.  He denies any family history of prostate cancer, recent PSA normal at 0.43.  Denies any urinary symptoms.   PMH: Past Medical History:  Diagnosis Date   Psoriasis    Rheumatoid arthritis     Surgical History: Past Surgical History:  Procedure Laterality Date   KNEE SURGERY       Social History:  reports that he has never smoked. He has never been exposed to tobacco smoke. He has never used smokeless tobacco. He reports that he does not drink alcohol. No history on file for drug use.  Physical Exam: BP 111/71   Pulse 65   Ht  (1.854 m)   Wt 245 lb (111.1 kg)   BMI 32.32 kg/m    Constitutional:  Alert and oriented, No acute distress. Cardiovascular: No clubbing, cyanosis, or edema. Respiratory: Normal respiratory effort, no increased work of breathing. GI: Abdomen is soft, nontender, nondistended, no abdominal masses GU: Circumcised phallus with patent meatus, testicles 20 cc and descended bilaterally without masses, vas deferens palpable bilaterally   Assessment & Plan:   42 year old male interested in vasectomy for permanent sterilization.  Recent PSA normal at 0.43, we reviewed the AUA guidelines that recommend screening every 2 to 4 years through age 23.  We discussed the risks and benefits of vasectomy at length.  Vasectomy is intended to be a permanent form of contraception, and does not produce immediate sterility.  Following vasectomy another form of contraception is required until vas occlusion is confirmed by a post-vasectomy semen analysis obtained 2-3 months after the procedure.  Even after vas occlusion is confirmed, vasectomy is not 100% reliable in preventing pregnancy, and the failure rate is approximately 02/1998.  Repeat vasectomy is  required in less than 1% of patients.  He should refrain from ejaculation for 1 week after vasectomy.  Options for fertility after vasectomy include vasectomy reversal, and sperm retrieval with in vitro fertilization or ICSI.  These options are not always successful and may be expensive.  Finally, there are other permanent and non-permanent alternatives to vasectomy available. There is no risk of erectile dysfunction, and the volume of semen will be similar to prior, as the majority of the ejaculate is from the prostate and seminal vesicles.   The procedure takes ~20 minutes.  We recommend patients take 5-10 mg of Valium 30 minutes prior, and he will need a driver post-procedure.  Local anesthetic is injected into the scrotal skin and a small segment of the vas deferens is removed, and the ends occluded. The complication rate is approximately 1-2%, and includes bleeding, infection, and development of chronic scrotal pain.  PLAN: Schedule vasectomy Valium sent to pharmacy   Legrand Rams, MD 06/15/2022  [facility] [address], [address] [place], [place] [address] [phone]

## 2022-06-28 ENCOUNTER — Encounter: Payer: Self-pay | Admitting: Urology

## 2022-06-28 ENCOUNTER — Ambulatory Visit (INDEPENDENT_AMBULATORY_CARE_PROVIDER_SITE_OTHER): Payer: BC Managed Care – PPO | Admitting: Urology

## 2022-06-28 VITALS — Ht 73.0 in | Wt 245.0 lb

## 2022-06-28 DIAGNOSIS — Z3009 Encounter for other general counseling and advice on contraception: Secondary | ICD-10-CM

## 2022-06-28 DIAGNOSIS — Z302 Encounter for sterilization: Secondary | ICD-10-CM

## 2022-06-28 DIAGNOSIS — Z9852 Vasectomy status: Secondary | ICD-10-CM

## 2022-06-28 NOTE — Patient Instructions (Signed)

## 2022-06-28 NOTE — Progress Notes (Signed)
VASECTOMY PROCEDURE NOTE:  The patient was taken to the minor procedure room and placed in the supine position. His genitals were prepped and draped in the usual sterile fashion. The right vas deferens was brought up to the skin of the right upper scrotum. The skin overlying it was anesthetized with 1% lidocaine without epinephrine, anesthetic was also injected alongside the vas deferens in the direction of the inguinal canal. The no scalpel vasectomy instrument was used to make a small perforation in the scrotal skin. The vasectomy clamp was used to grasp the vas deferens. It was carefully dissected free from surrounding structures. A 1cm segment of the vas was removed, and the cut ends of the mucosa were cauterized. A figure of eight suture was used to perform fascial interposition. No significant bleeding was noted. The vas deferens was returned to the scrotum. The skin incision was closed with a simple interrupted stitch of 4-0 chromic.  Attention was then turned to the left side. The left vasectomy was performed in the same exact fashion. Sterile dressings were placed over each incision. The patient tolerated the procedure well.  IMPRESSION/DIAGNOSIS: The patient is a 42 year old gentleman who underwent a vasectomy today. Post-procedure instructions were reviewed. I stressed the importance of continuing to use birth control until he provides a semen specimen more than 2 months from now that demonstrates azoospermia.  We discussed return precautions including fever over 101, significant bleeding or hematoma, or uncontrolled pain. I also stressed the importance of avoiding strenuous activity for one week, no sexual activity or ejaculations for 5 days, intermittent icing over the next 48 hours, and scrotal support.   PLAN: The patient will be advised of his semen analysis results when available.  Legrand Rams, MD 06/28/2022

## 2022-09-28 ENCOUNTER — Other Ambulatory Visit: Payer: BC Managed Care – PPO

## 2022-09-28 DIAGNOSIS — Z9852 Vasectomy status: Secondary | ICD-10-CM

## 2023-01-31 ENCOUNTER — Ambulatory Visit
Admission: RE | Admit: 2023-01-31 | Discharge: 2023-01-31 | Disposition: A | Discharge status: Home / Self Care | Payer: BC Managed Care – PPO | Source: Ambulatory Visit | Attending: Dermatology | Admitting: Dermatology

## 2023-01-31 ENCOUNTER — Ambulatory Visit
Admission: RE | Admit: 2023-01-31 | Discharge: 2023-01-31 | Disposition: A | Discharge status: Home / Self Care | Payer: BC Managed Care – PPO | Attending: Dermatology | Admitting: Dermatology

## 2023-01-31 ENCOUNTER — Other Ambulatory Visit: Payer: Self-pay | Admitting: Dermatology

## 2023-01-31 DIAGNOSIS — M2550 Pain in unspecified joint: Secondary | ICD-10-CM
